# Patient Record
Sex: Female | Born: 1955 | Race: White | Hispanic: No | Marital: Married
Health system: Southern US, Community
[De-identification: ages and names within clinical notes are randomized; demographics above are authoritative.]

---

## 2013-04-07 ENCOUNTER — Other Ambulatory Visit: Payer: Self-pay | Admitting: Orthopedic Surgery

## 2013-04-07 DIAGNOSIS — M4800 Spinal stenosis, site unspecified: Secondary | ICD-10-CM

## 2013-04-07 DIAGNOSIS — M542 Cervicalgia: Secondary | ICD-10-CM

## 2013-04-14 ENCOUNTER — Other Ambulatory Visit: Payer: Self-pay

## 2013-04-19 ENCOUNTER — Ambulatory Visit
Admission: RE | Admit: 2013-04-19 | Discharge: 2013-04-19 | Disposition: A | Discharge status: Home / Self Care | Payer: BC Managed Care – PPO | Source: Ambulatory Visit | Attending: Orthopedic Surgery | Admitting: Orthopedic Surgery

## 2013-04-19 DIAGNOSIS — M4800 Spinal stenosis, site unspecified: Secondary | ICD-10-CM

## 2013-04-19 DIAGNOSIS — M542 Cervicalgia: Secondary | ICD-10-CM

## 2013-06-26 ENCOUNTER — Other Ambulatory Visit: Payer: Self-pay | Admitting: Orthopedic Surgery

## 2013-06-26 DIAGNOSIS — M25511 Pain in right shoulder: Secondary | ICD-10-CM

## 2013-06-26 DIAGNOSIS — M25512 Pain in left shoulder: Principal | ICD-10-CM

## 2013-07-05 ENCOUNTER — Ambulatory Visit
Admission: RE | Admit: 2013-07-05 | Discharge: 2013-07-05 | Disposition: A | Discharge status: Home / Self Care | Payer: BC Managed Care – PPO | Source: Ambulatory Visit | Attending: Orthopedic Surgery | Admitting: Orthopedic Surgery

## 2013-07-05 DIAGNOSIS — M25512 Pain in left shoulder: Principal | ICD-10-CM

## 2013-07-05 DIAGNOSIS — M25511 Pain in right shoulder: Secondary | ICD-10-CM

## 2013-07-07 ENCOUNTER — Other Ambulatory Visit: Payer: BC Managed Care – PPO

## 2017-05-04 ENCOUNTER — Ambulatory Visit
Admission: RE | Admit: 2017-05-04 | Discharge: 2017-05-04 | Disposition: A | Discharge status: Home / Self Care | Payer: No Typology Code available for payment source | Source: Ambulatory Visit | Attending: Orthopedic Surgery | Admitting: Orthopedic Surgery

## 2017-05-04 ENCOUNTER — Other Ambulatory Visit: Payer: Self-pay | Admitting: Orthopedic Surgery

## 2017-05-04 DIAGNOSIS — M5416 Radiculopathy, lumbar region: Secondary | ICD-10-CM

## 2019-10-19 IMAGING — MR MR LUMBAR SPINE W/O CM
4 of 5 series · 24 of 48 positions shown · non-contrast
Comparison: None.

CLINICAL DATA: Acute onset low back pain with left groin and leg
pain and numbness 5 days ago. Initial encounter.

EXAM:
MRI LUMBAR SPINE WITHOUT CONTRAST
TECHNIQUE: Multiplanar, multisequence MR imaging of the lumbar spine was
performed. No intravenous contrast was administered.

[Series 3: T2 · sagittal · 4.0mm · 0.55mm/px · 6 of 17 slices shown (1 of 2)]
[im 1/17]
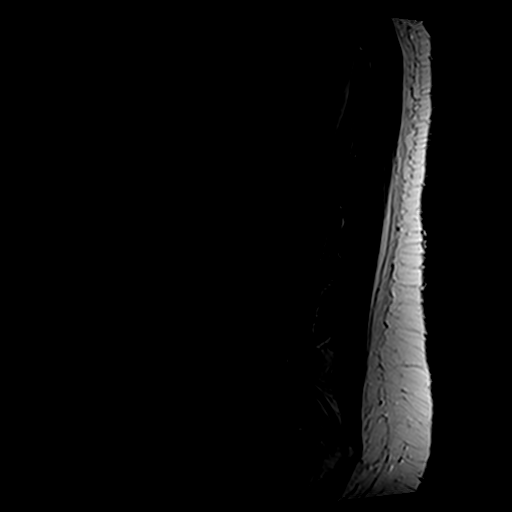
[im 4/17]
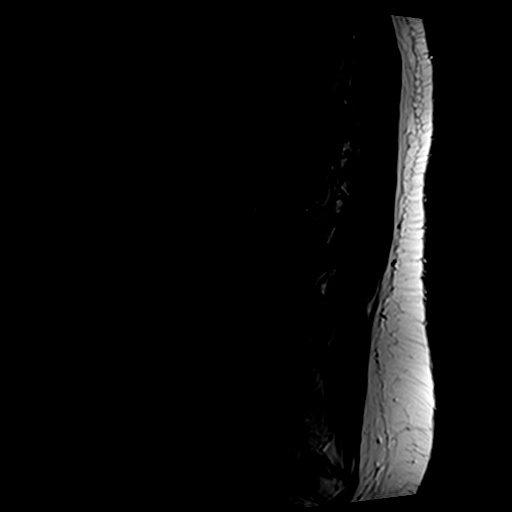
[im 7/17]
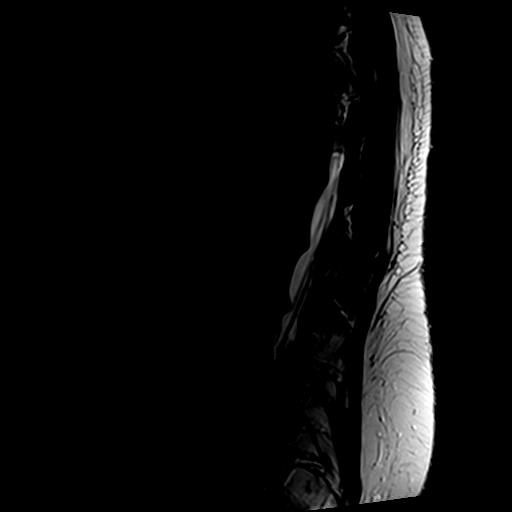
[im 10/17]
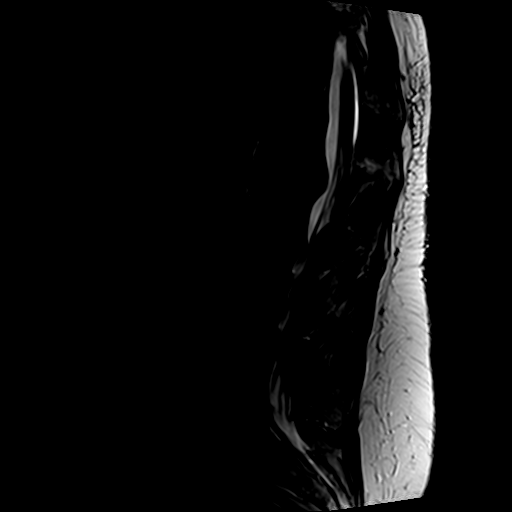
[im 13/17]
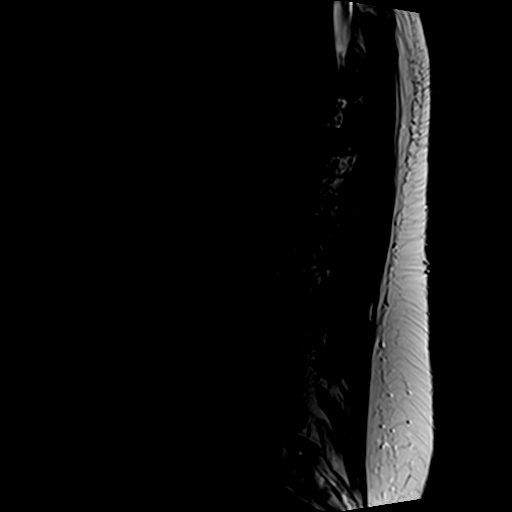
[im 17/17]
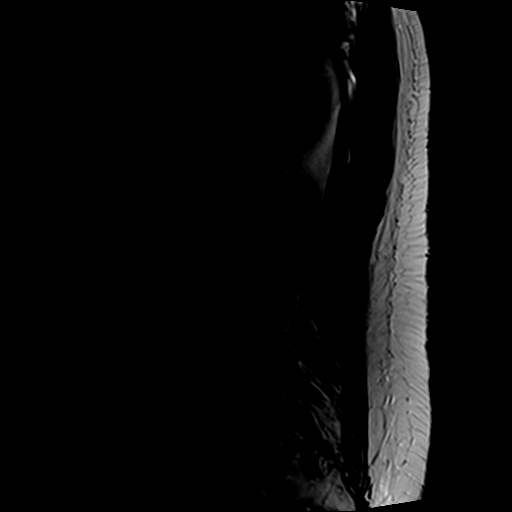

[Series 4: T1 · sagittal · 4.0mm · 0.55mm/px · 6 of 17 slices shown (1 of 2)]
[im 1/17]
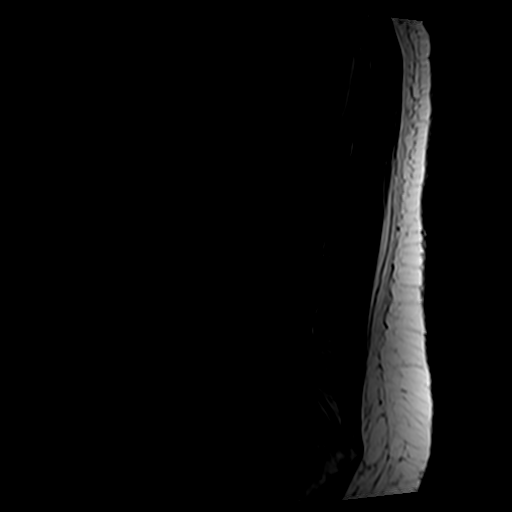
[im 4/17]
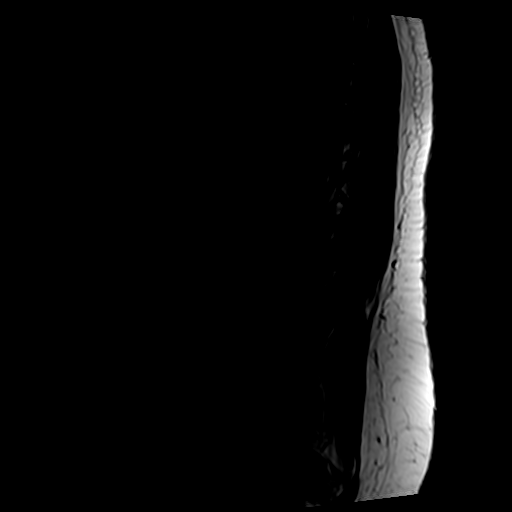
[im 7/17]
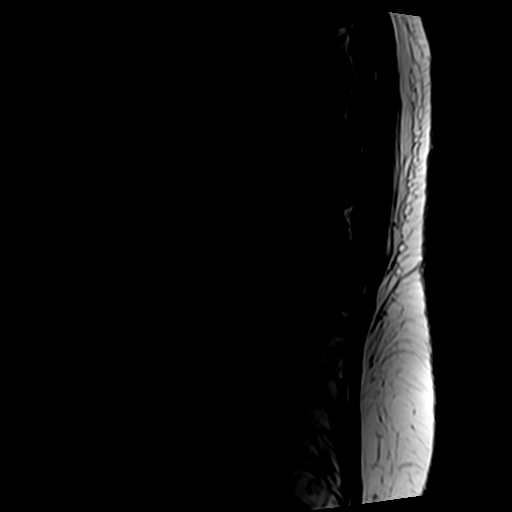
[im 10/17]
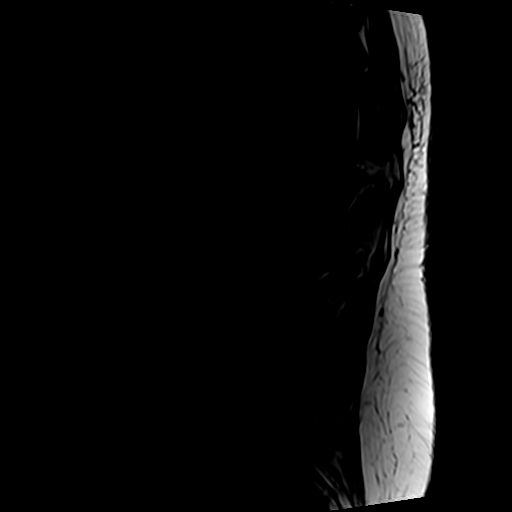
[im 13/17]
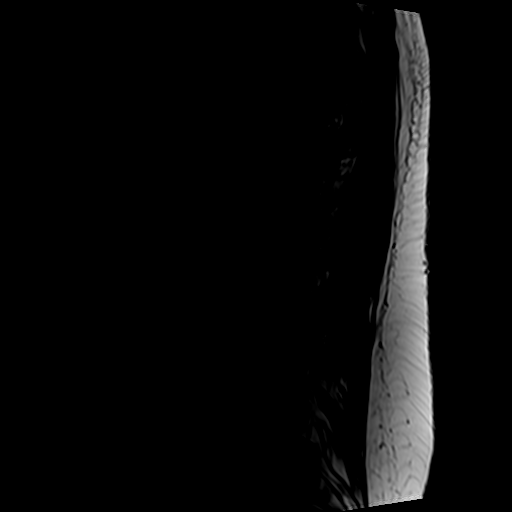
[im 17/17]
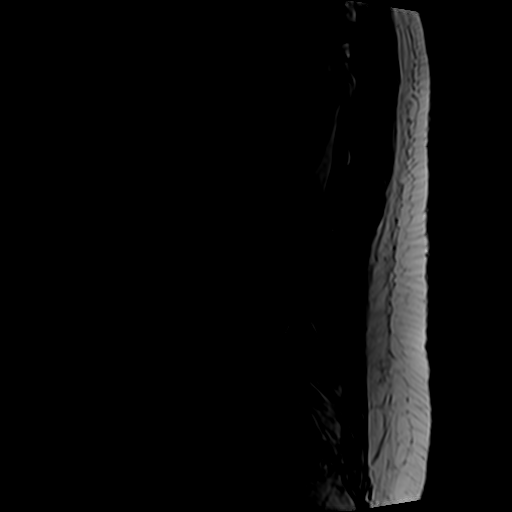

[Series 9: T2 · axial · 4.0mm · 0.70mm/px · z∈[-162,+40]mm · 9 of 39 slices shown (2 of 2)]
[im 1/39]
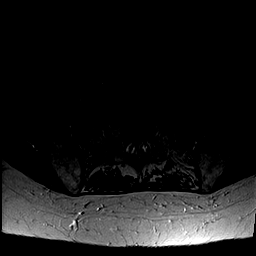
[im 6/39]
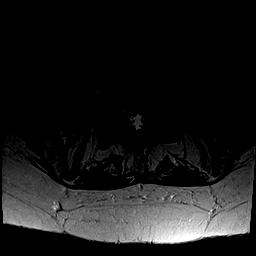
[im 11/39]
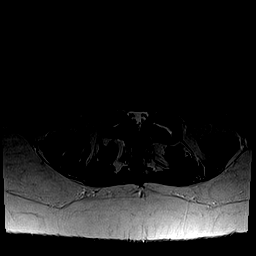
[im 17/39]
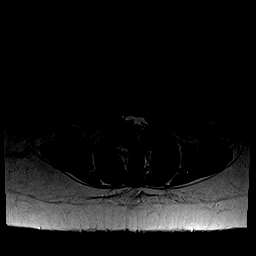
[im 20/39]
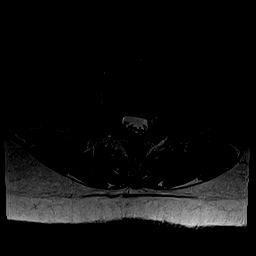
[im 22/39]
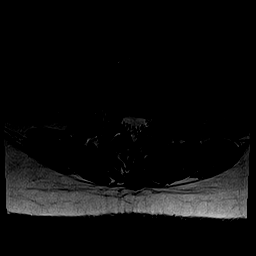
[im 28/39]
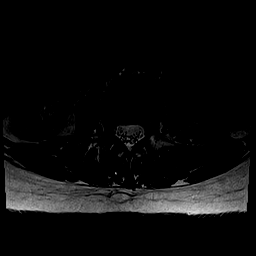
[im 33/39]
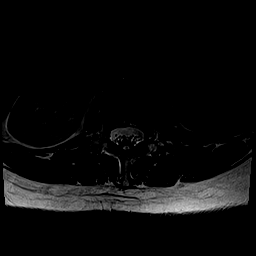
[im 39/39]
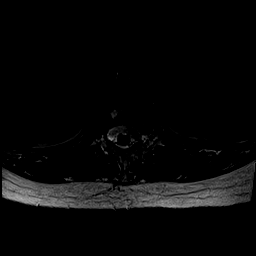

[Series 10: T1 · axial · 4.0mm · 0.35mm/px · z∈[-136,+9]mm · 3 of 39 slices shown (2 of 2)]
[im 6/39]
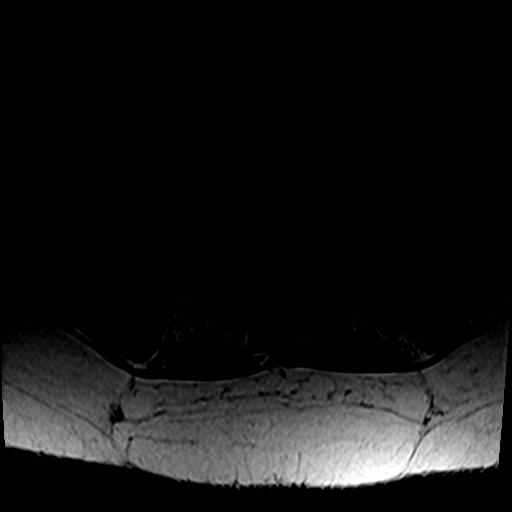
[im 20/39]
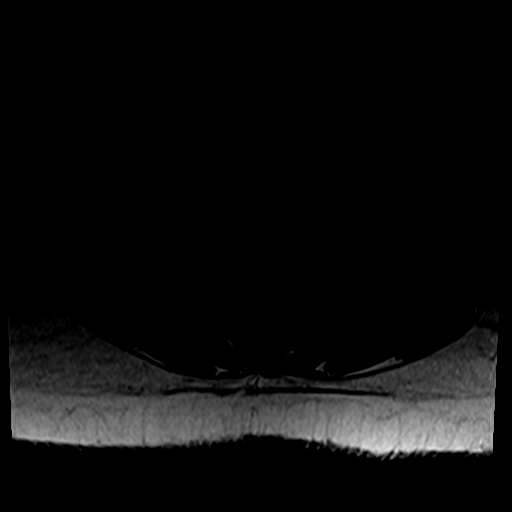
[im 33/39]
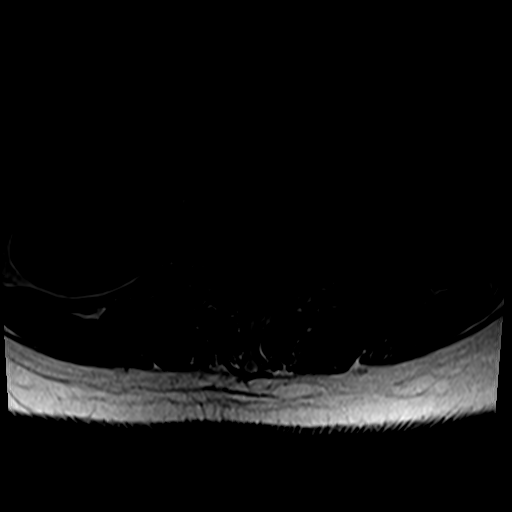

[24 of 48 positions shown; findings below may reference images not displayed]

FINDINGS: Segmentation:  Standard.

Alignment:  Convex left scoliosis is seen.

Vertebrae:  No fracture or worrisome lesion.

Conus medullaris and cauda equina: Conus extends to the L1 level.
Conus and cauda equina appear normal.

Paraspinal and other soft tissues: Negative.

Disc levels:

The patient moved during the examination. Correlate disc level on
axial imaging with the sagittal STIR sequence, series 8.

T10-11 and T11-12 are imaged in the sagittal plane only. There is
some disc desiccation at T11-12 but no bulge or protrusion at either
level. The central canal and foramina are open.

T12-L1: Negative.

L1-2: Shallow right paracentral protrusion without central canal or
foraminal stenosis.

L2-3: Shallow disc bulge and mild facet degenerative change. The
central canal and foramina are open.

L3-4: The patient has a shallow disc bulge with a superimposed left
lateral recess disc protrusion impinging on the descending left L4
root. Also seen is a large protrusion in the left foramen impinging
on the exiting left L3 root. There is mild central canal stenosis
overall. The right foramen is widely patent.

L4-5: Mild-to-moderate facet degenerative change. Shallow disc bulge
with a small annular fissure and ligamentum flavum thickening. There
is mild to moderate central canal stenosis with some narrowing in
the left subarticular recess. Neural foramina are open.

L5-S1: Ligamentum flavum thickening is more notable on the left.
There is a shallow disc bulge and small annular fissure. Mild
narrowing is present in the left subarticular recess. The foramina
are open.
IMPRESSION: As noted above, axial imaging should be correlated with the sagittal
STIR sequence, series 8, for the correct level. The patient moved
during the examination.

Large protrusion in the left foramen at L3-4 impinges on the exiting
left L3 root. The patient also has a small left lateral recess
protrusion L3-4 encroaching on the descending left L4 root.

Mild to moderate central canal stenosis and narrowing in the left
subarticular recess at L4-5 due to a shallow disc bulge with an
annular fissure and ligamentum flavum thickening.

Mild narrowing in the left subarticular recess at L5-S1 where there
is a shallow disc bulge and ligamentum flavum thickening.
